# Patient Record
Sex: Male | Born: 1985 | Race: White | Hispanic: No | Marital: Single | Smoking: Never smoker
Health system: Southern US, Community
[De-identification: ages and names within clinical notes are randomized; demographics above are authoritative.]

## PROBLEM LIST (undated history)

## (undated) DIAGNOSIS — M2242 Chondromalacia patellae, left knee: Secondary | ICD-10-CM

## (undated) DIAGNOSIS — F32A Depression, unspecified: Secondary | ICD-10-CM

## (undated) DIAGNOSIS — F329 Major depressive disorder, single episode, unspecified: Secondary | ICD-10-CM

## (undated) DIAGNOSIS — F419 Anxiety disorder, unspecified: Secondary | ICD-10-CM

## (undated) DIAGNOSIS — I10 Essential (primary) hypertension: Secondary | ICD-10-CM

## (undated) DIAGNOSIS — R011 Cardiac murmur, unspecified: Secondary | ICD-10-CM

## (undated) HISTORY — DX: Essential (primary) hypertension: I10

## (undated) HISTORY — PX: ORIF FINGER FRACTURE: SHX2122

## (undated) HISTORY — DX: Anxiety disorder, unspecified: F41.9

## (undated) HISTORY — PX: ORIF HUMERUS FRACTURE: SHX2126

---
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
## 2006-08-08 IMAGING — CR DG CHEST 2V
2 series · 2 of 2 positions shown · non-contrast
Comparison: none

CLINICAL DATA: Chest pain, short of breath.  Chest pressure.  Arrhythmia.
 CHEST - 2 VIEW:
 The heart size and mediastinal contours are within normal limits.  Both lungs are clear.  The visualized skeletal structures are unremarkable.

[w chest pa]
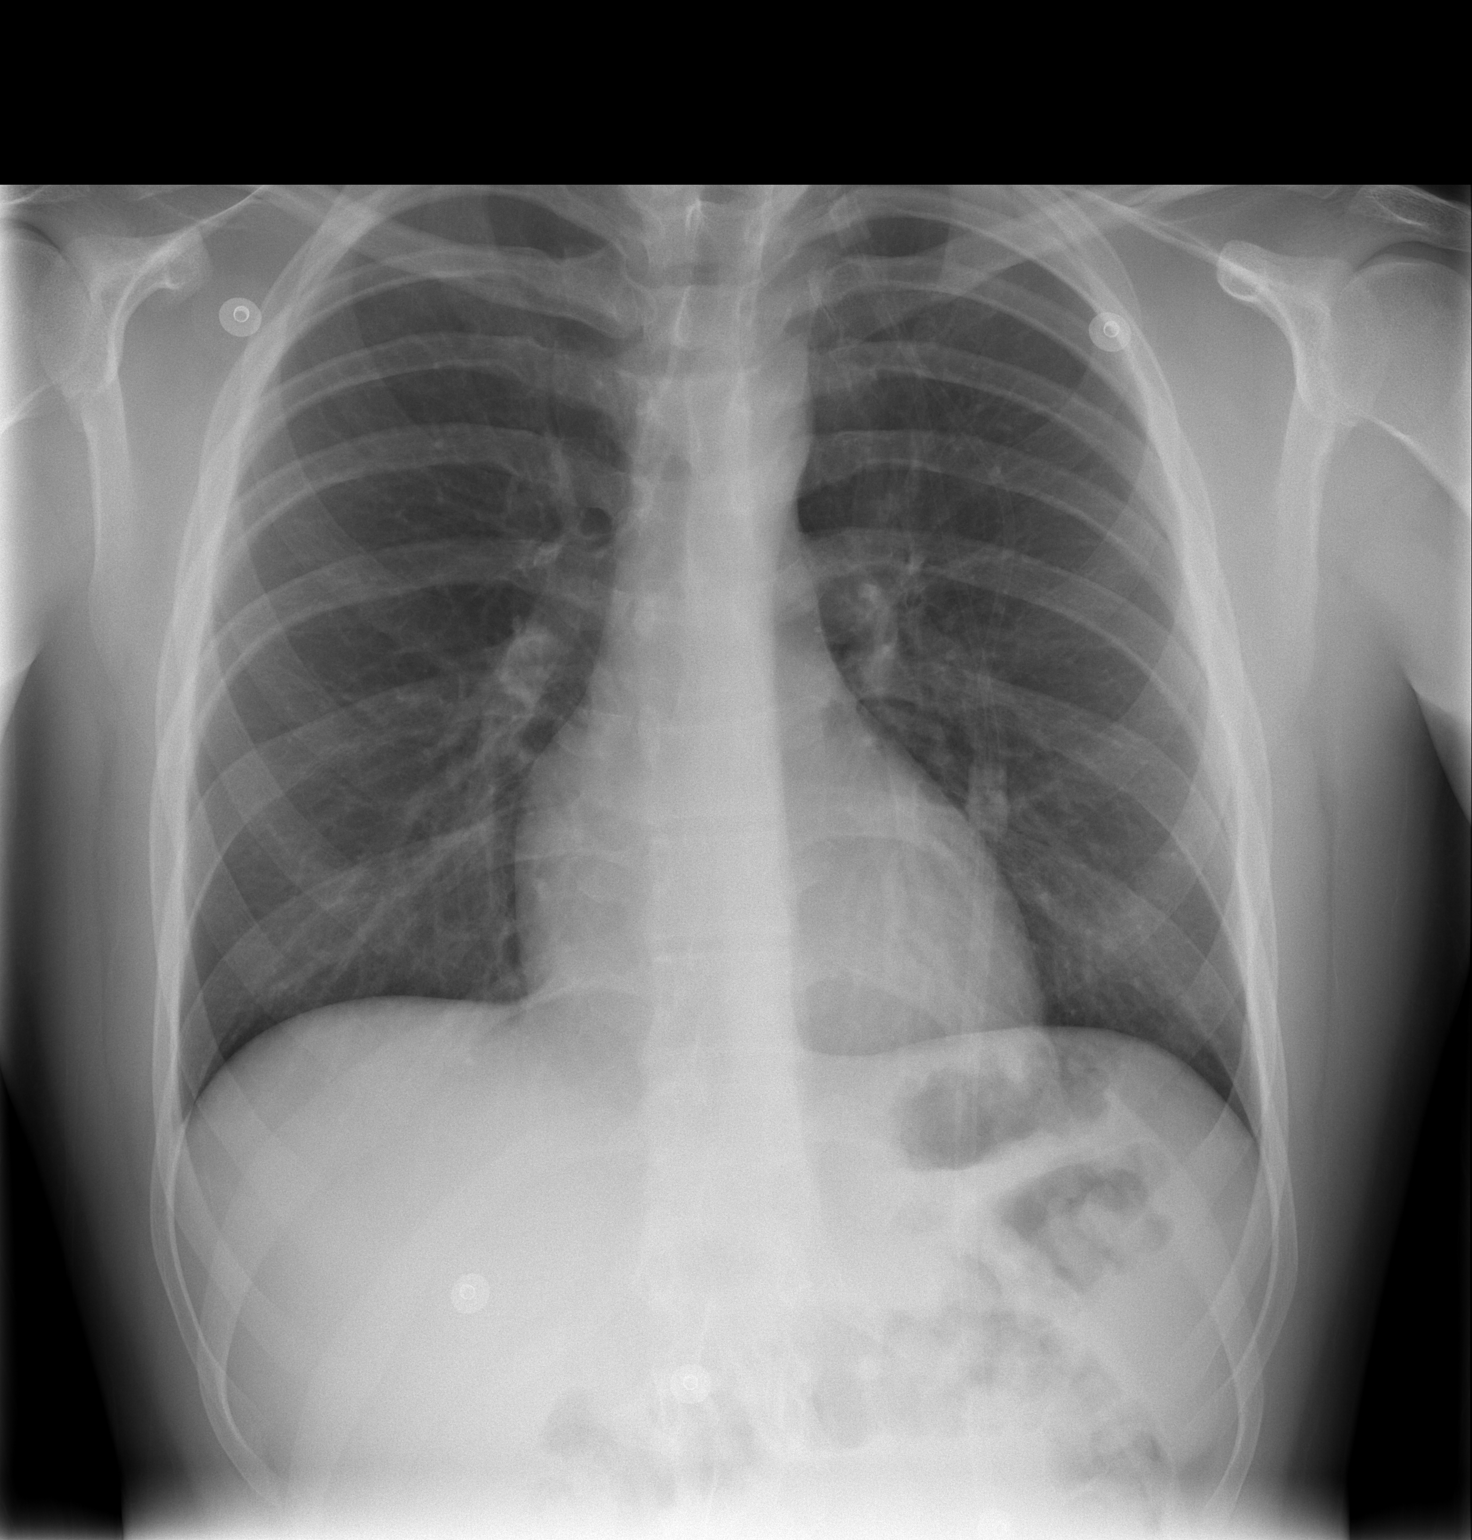

[w chest lat]
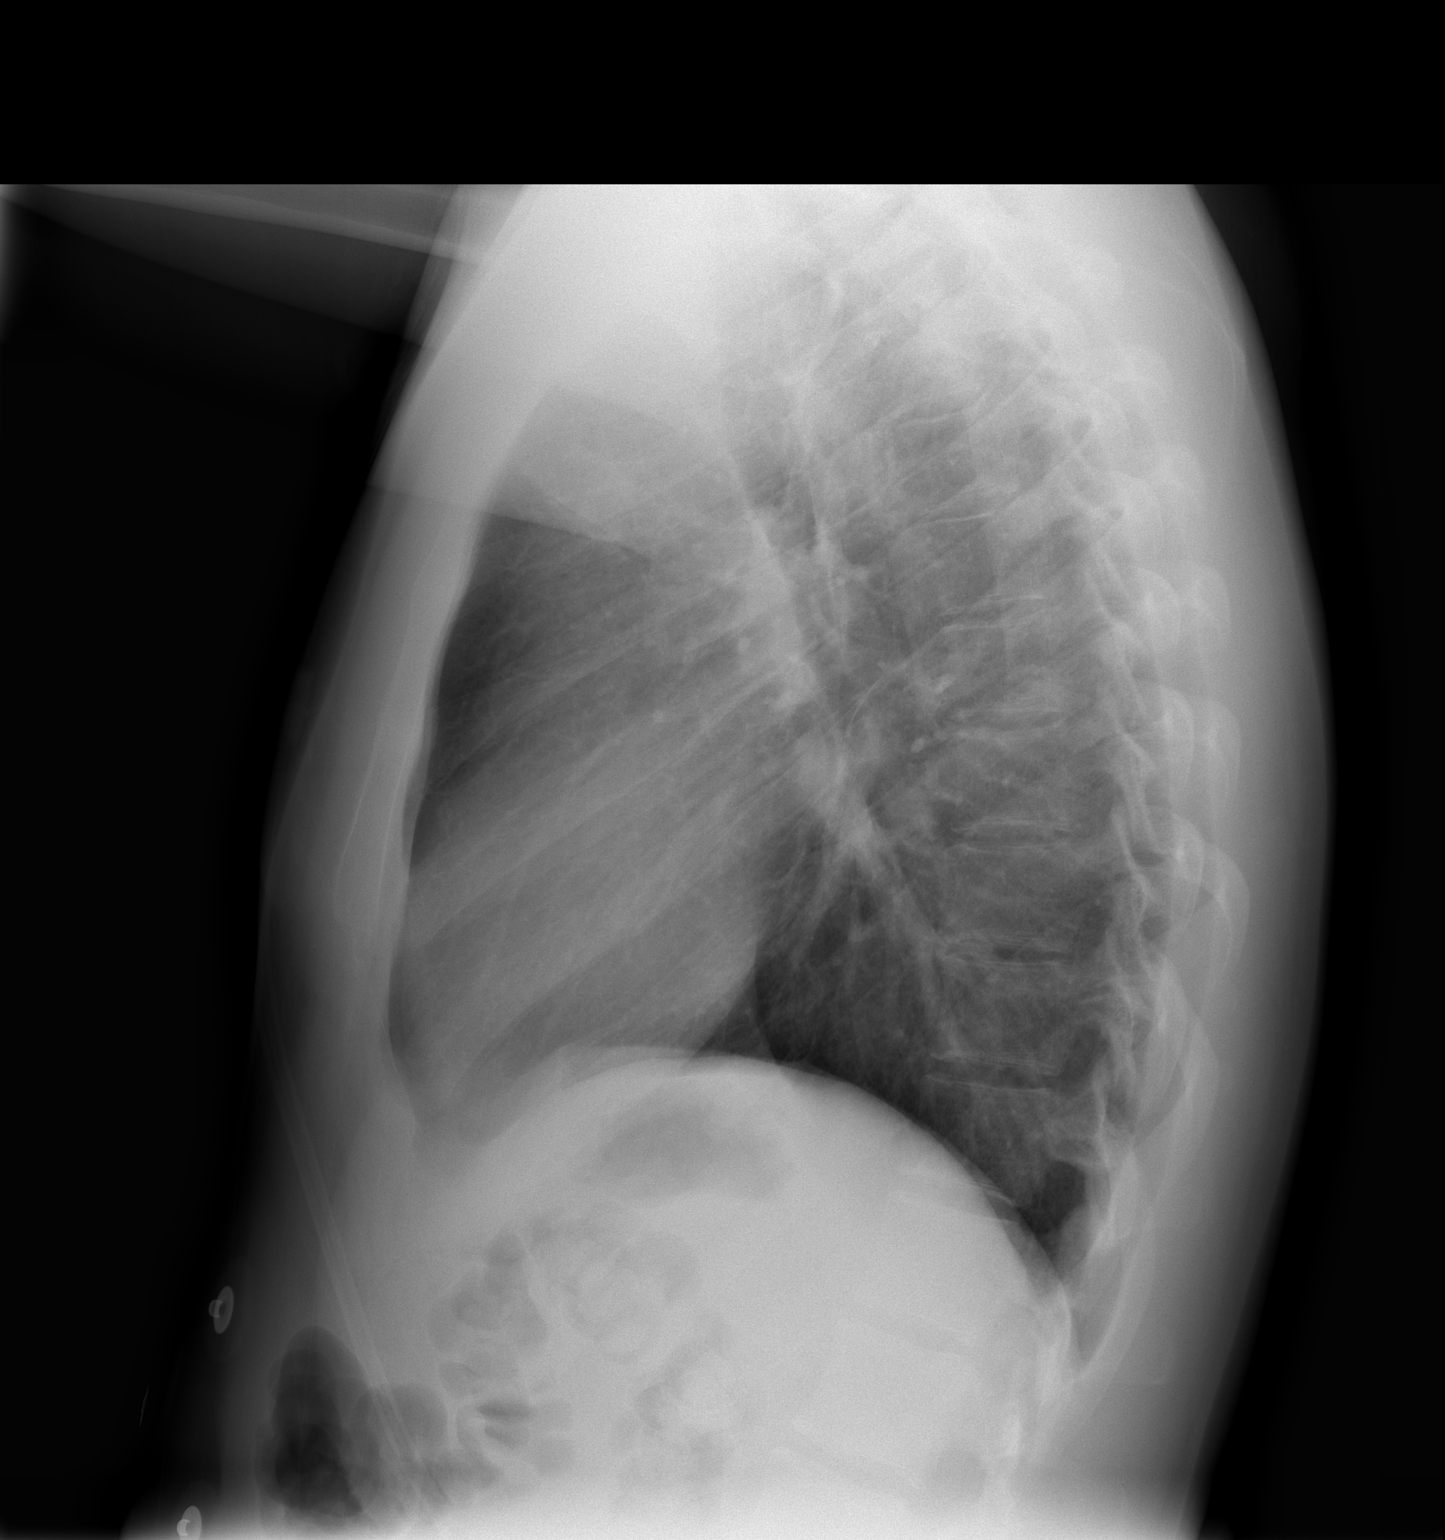

[2 of 2 positions shown; findings below may reference images not displayed]

IMPRESSION: No active cardiopulmonary disease.

## 2012-07-27 ENCOUNTER — Encounter: Payer: Self-pay | Admitting: Family Medicine

## 2012-07-27 DIAGNOSIS — F419 Anxiety disorder, unspecified: Secondary | ICD-10-CM | POA: Insufficient documentation

## 2012-07-27 DIAGNOSIS — S83006A Unspecified dislocation of unspecified patella, initial encounter: Secondary | ICD-10-CM | POA: Insufficient documentation

## 2012-08-21 ENCOUNTER — Telehealth: Payer: Self-pay | Admitting: *Deleted

## 2012-08-21 MED ORDER — PAROXETINE HCL 20 MG PO TABS: 20.0000 mg | ORAL_TABLET | Freq: Every day | ORAL | Status: DC

## 2012-08-21 NOTE — Telephone Encounter (Signed)
I gave him 30 days. Please let him know this was sent to his pharmacy Production assistant, radio) Thanks PG

## 2012-08-21 NOTE — Telephone Encounter (Signed)
PT NEEDS PAXIL REFILL FOR AT LEAST 5 DAYS. HE HAS APPT. NEXT WEEK. RX PAXIL.

## 2012-09-05 ENCOUNTER — Ambulatory Visit (INDEPENDENT_AMBULATORY_CARE_PROVIDER_SITE_OTHER): Payer: BC Managed Care – PPO | Admitting: Family Medicine

## 2012-09-05 ENCOUNTER — Encounter: Payer: Self-pay | Admitting: Family Medicine

## 2012-09-05 VITALS — BP 142/74 | HR 75 | Wt 228.0 lb

## 2012-09-05 DIAGNOSIS — I10 Essential (primary) hypertension: Secondary | ICD-10-CM

## 2012-09-05 DIAGNOSIS — F411 Generalized anxiety disorder: Secondary | ICD-10-CM

## 2012-09-05 MED ORDER — PAROXETINE HCL 20 MG PO TABS: 20.0000 mg | ORAL_TABLET | Freq: Every day | ORAL | Status: DC

## 2012-09-05 MED ORDER — HYDROCHLOROTHIAZIDE 25 MG PO TABS: 25.0000 mg | ORAL_TABLET | Freq: Every day | ORAL | Status: DC

## 2012-09-05 NOTE — Progress Notes (Signed)
Subjective:     Patient ID: Darryl Sloan, male   DOB: November 26, 1985, 27 y.o.   MRN: [id number]  HPI Darryl Sloan is here today to get his Paxil refilled.  He has done since his last office visit. His dad is interested in him coming off of medication.    Review of Systems  Psychiatric/Behavioral: The patient is not nervous/anxious.        Objective:   Physical Exam  Constitutional: He appears well-nourished. No distress.  Neck: No thyromegaly present.  Psychiatric: He has a normal mood and affect. His behavior is normal. Judgment and thought content normal.       Assessment:     Anxiety    Plan:     He will continue on the Paxil for now.  He may decide to wean himself down in the future.

## 2012-09-05 NOTE — Patient Instructions (Addendum)
1)  Anxiety - Whenever you decide you want to stop the Paxil, you need to do it VERY slowly.  I would recommend that you start by taking 1/2 tab daily for one month. They you can take either 1/4 daily or 1/2 every other day for a month and then cut back by that dosage weekly over the next month.    2)  Blood Pressure - Dash Diet in addition to the HCTZ.  If you lose weight and continue to exercise, your BP may go down enough to stop the HCTZ.     DASH Diet The DASH diet stands for "Dietary Approaches to Stop Hypertension." It is a healthy eating plan that has been shown to reduce high blood pressure (hypertension) in as little as 14 days, while also possibly providing other significant health benefits. These other health benefits include reducing the risk of breast cancer after menopause and reducing the risk of type 2 diabetes, heart disease, colon cancer, and stroke. Health benefits also include weight loss and slowing kidney failure in patients with chronic kidney disease.  DIET GUIDELINES  Limit salt (sodium). Your diet should contain less than 1500 mg of sodium daily.  Limit refined or processed carbohydrates. Your diet should include mostly whole grains. Desserts and added sugars should be used sparingly.  Include small amounts of heart-healthy fats. These types of fats include nuts, oils, and tub margarine. Limit saturated and trans fats. These fats have been shown to be harmful in the body. CHOOSING FOODS  The following food groups are based on a 2000 calorie diet. See your Registered Dietitian for individual calorie needs. Grains and Grain Products (6 to 8 servings daily)  Eat More Often: Whole-wheat bread, brown rice, whole-grain or wheat pasta, quinoa, popcorn without added fat or salt (air popped).  Eat Less Often: White bread, white pasta, white rice, cornbread. Vegetables (4 to 5 servings daily)  Eat More Often: Fresh, frozen, and canned vegetables. Vegetables may be raw,  steamed, roasted, or grilled with a minimal amount of fat.  Eat Less Often/Avoid: Creamed or fried vegetables. Vegetables in a cheese sauce. Fruit (4 to 5 servings daily)  Eat More Often: All fresh, canned (in natural juice), or frozen fruits. Dried fruits without added sugar. One hundred percent fruit juice ( cup [237 mL] daily).  Eat Less Often: Dried fruits with added sugar. Canned fruit in light or heavy syrup. [employer], Fish, and Poultry (2 servings or less daily. One serving is 3 to 4 oz [85-114 g]).  Eat More Often: Ninety percent or leaner ground beef, tenderloin, sirloin. Round cuts of beef, chicken breast, [place] breast. All fish. Grill, bake, or broil your meat. Nothing should be fried.  Eat Less Often/Avoid: Fatty cuts of meat, [place], or chicken leg, thigh, or wing. Fried cuts of meat or fish. Dairy (2 to 3 servings)  Eat More Often: Low-fat or fat-free milk, low-fat plain or light yogurt, reduced-fat or part-skim cheese.  Eat Less Often/Avoid: Milk (whole, 2%).Whole milk yogurt. Full-fat cheeses. Nuts, Seeds, and Legumes (4 to 5 servings per week)  Eat More Often: All without added salt.  Eat Less Often/Avoid: Salted nuts and seeds, canned beans with added salt. Fats and Sweets (limited)  Eat More Often: Vegetable oils, tub margarines without trans fats, sugar-free gelatin. Mayonnaise and salad dressings.  Eat Less Often/Avoid: Coconut oils, palm oils, butter, stick margarine, cream, half and half, cookies, candy, pie. FOR MORE INFORMATION The Dash Diet Eating Plan: www.dashdiet.org Document Released: 05/06/2011  Document Revised: 08/09/2011 Document Reviewed: 05/06/2011 [facility] Patient Information 2013 [place], [place].

## 2013-05-15 ENCOUNTER — Encounter: Payer: Self-pay | Admitting: Family Medicine

## 2013-05-15 ENCOUNTER — Ambulatory Visit (INDEPENDENT_AMBULATORY_CARE_PROVIDER_SITE_OTHER): Payer: BC Managed Care – PPO | Admitting: Family Medicine

## 2013-05-15 ENCOUNTER — Encounter (INDEPENDENT_AMBULATORY_CARE_PROVIDER_SITE_OTHER): Payer: Self-pay

## 2013-05-15 VITALS — BP 151/85 | HR 85 | Resp 16 | Ht 69.5 in | Wt 229.0 lb

## 2013-05-15 DIAGNOSIS — F411 Generalized anxiety disorder: Secondary | ICD-10-CM

## 2013-05-15 DIAGNOSIS — Z23 Encounter for immunization: Secondary | ICD-10-CM

## 2013-05-15 DIAGNOSIS — R5381 Other malaise: Secondary | ICD-10-CM

## 2013-05-15 DIAGNOSIS — I10 Essential (primary) hypertension: Secondary | ICD-10-CM

## 2013-05-15 MED ORDER — FLUOXETINE HCL 20 MG PO TABS: 20.0000 mg | ORAL_TABLET | Freq: Every day | ORAL | Status: DC

## 2013-05-15 MED ORDER — BUPROPION HCL ER (XL) 150 MG PO TB24: 150.0000 mg | ORAL_TABLET | ORAL | Status: DC

## 2013-05-15 MED ORDER — PAROXETINE HCL 10 MG PO TABS: ORAL_TABLET | ORAL | Status: DC

## 2013-05-15 NOTE — Progress Notes (Signed)
Subjective:    Patient ID: Darryl Sloan, male    DOB: 1986-05-26, 27 y.o.   MRN: [id number]  HPI  Darryl Sloan is here today to discuss a couple of issues:     1)  Anxiety/Depression:  He has been on Paxil for several years.  He feels that it has stabilized his mood but he feels that it is keeping him from losing weight.  He has tried to eat healthier and has been working out but he still does not lose weight.  He wants to switch to another medication that does not have weight gain as a side effect.    2)  Hypertension:  He has not been consistent taking his HCTZ.  He took it for a couple of months and thought that he did not need it.  His blood pressure is elevated today.     Review of Systems  Constitutional: Positive for unexpected weight change.  Eyes: Positive for visual disturbance.  Neurological: Positive for light-headedness.  All other systems reviewed and are negative.    Past Medical History  Diagnosis Date  . Anxiety   . Knee dislocation     Left - Torn Ligaments -  Dr Eulah Pont     Past Surgical History  Procedure Laterality Date  . Elbow surgery Right 2000    Upper arm - Dr Eulah Pont  . Knee surgery Left   . Finger surgery Right     Right Middle Finger     History   Social History Narrative   Marital Status: Single   Children:  None   Pets: Dogs (3)    Living Situation: Lives with his parents.    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 Diabetes type II Mother   . Depression Mother   . Leukemia Mother   . Hypertension Father   . Depression Brother      Current Outpatient Prescriptions on File Prior to Visit  Medication Sig Dispense Refill  . PARoxetine (PAXIL) 20 MG tablet Take 1 tablet (20 mg total) by mouth  daily.  90 tablet  1  . hydrochlorothiazide (HYDRODIURIL) 25 MG tablet Take 1 tablet (25 mg total) by mouth daily.  90 tablet  3   No current facility-administered medications on file prior to visit.     No Known Allergies   Immunization History  Administered Date(s) Administered  . Influenza,inj,Quad PF,36+ Mos 05/15/2013      Objective:   Physical Exam  Constitutional: He is oriented to person, place, and time. He appears well-nourished. No distress.  HENT:  Head: Normocephalic.  Eyes: No scleral icterus.  Neck: Neck supple. No thyromegaly present.  Cardiovascular: Normal rate, regular rhythm and normal heart sounds.   Pulmonary/Chest: Effort normal and breath sounds normal.  Musculoskeletal: Normal range of motion. He exhibits no edema.  Neurological: He is alert and oriented to person, place, and time.  Skin: Skin is warm and dry. No rash noted.  Psychiatric: He has a normal mood and affect. His behavior is normal. Judgment and thought content normal.      Assessment & Plan:    Darryl Sloan was seen today for medication management.  Diagnoses and associated orders for this visit:  Anxiety state, unspecified Comments: We will wean him down from Paxil and start him on Prozac.   - FLUoxetine (PROZAC) 20 MG tablet; Take 1 tablet (20 mg total) by mouth daily. - PARoxetine (PAXIL) 10 MG tablet; Alternate as we discussed with Prozac x 1 month  Other malaise and fatigue Comments: We'll also add some Wellbutrin to see how this works for him.  - buPROPion (WELLBUTRIN XL) 150 MG 24 hr tablet; Take 1 tablet (150 mg total) by mouth every morning.  Essential hypertension, benign Comments: He was reminded to take his HCTZ daily.   Need for prophylactic vaccination and inoculation against influenza - Flu Vaccine QUAD 36+ mos PF IM (Fluarix)   TIME SPENT "FACE TO FACE" WITH PATIENT -  30 MINS

## 2013-05-15 NOTE — Assessment & Plan Note (Signed)
The patient confirmed that they are not allergic to eggs and have never had a bad reaction with the flu shot in the past.  The vaccination was given without difficulty.   

## 2013-05-15 NOTE — Patient Instructions (Signed)
1)    Week 1 - Alternate 20 mg of Paxil at night with Prozac 20 mg in am  Week 2 - Take Prozac 20 mg every morning and 10 mg of Paxil at night Week 3 - Prozac in am and 5 mg of Paxil at night  Week 4 - Add Wellbutrin 150 mg to the Prozac 20 mg  Week 5 - Same as 4  Week 6 - Same as 5   During all this time you need to be taking your HCTZ every morning as well.    F/U in 6 weeks for a recheck of your mood/energy/BP  We will decide at that time if we will keep you on the 20/150 or if we need to increase to 40; 300   Hypertension As your heart beats, it forces blood through your arteries. This force is your blood pressure. If the pressure is too high, it is called hypertension (HTN) or high blood pressure. HTN is dangerous because you may have it and not know it. High blood pressure may mean that your heart has to work harder to pump blood. Your arteries may be narrow or stiff. The extra work puts you at risk for heart disease, stroke, and other problems.  Blood pressure consists of two numbers, a higher number over a lower, 110/72, for example. It is stated as "110 over 72." The ideal is below 120 for the top number (systolic) and under 80 for the bottom (diastolic). Write down your blood pressure today. You should pay close attention to your blood pressure if you have certain conditions such as:  Heart failure.  Prior heart attack.  Diabetes  Chronic kidney disease.  Prior stroke.  Multiple risk factors for heart disease. To see if you have HTN, your blood pressure should be measured while you are seated with your arm held at the level of the heart. It should be measured at least twice. A one-time elevated blood pressure reading (especially in the Emergency Department) does not mean that you need treatment. There may be conditions in which the blood pressure is different between your right and left arms. It is important to see your caregiver soon for a recheck. Most people have  essential hypertension which means that there is not a specific cause. This type of high blood pressure may be lowered by changing lifestyle factors such as:  Stress.  Smoking.  Lack of exercise.  Excessive weight.  Drug/tobacco/alcohol use.  Eating less salt. Most people do not have symptoms from high blood pressure until it has caused damage to the body. Effective treatment can often prevent, delay or reduce that damage. TREATMENT  When a cause has been identified, treatment for high blood pressure is directed at the cause. There are a large number of medications to treat HTN. These fall into several categories, and your caregiver will help you select the medicines that are best for you. Medications may have side effects. You should review side effects with your caregiver. If your blood pressure stays high after you have made lifestyle changes or started on medicines,   Your medication(s) may need to be changed.  Other problems may need to be addressed.  Be certain you understand your prescriptions, and know how and when to take your medicine.  Be sure to follow up with your caregiver within the time frame advised (usually within two weeks) to have your blood pressure rechecked and to review your medications.  If you are taking more than one medicine  to lower your blood pressure, make sure you know how and at what times they should be taken. Taking two medicines at the same time can result in blood pressure that is too low. SEEK IMMEDIATE MEDICAL CARE IF:  You develop a severe headache, blurred or changing vision, or confusion.  You have unusual weakness or numbness, or a faint feeling.  You have severe chest or abdominal pain, vomiting, or breathing problems. MAKE SURE YOU:   Understand these instructions.  Will watch your condition.  Will get help right away if you are not doing well or get worse. Document Released: 05/17/2005 Document Revised: 08/09/2011 Document  Reviewed: 01/05/2008 [facility] Patient Information 2014 [place], [place].

## 2013-06-25 ENCOUNTER — Encounter: Payer: Self-pay | Admitting: Family Medicine

## 2013-06-26 ENCOUNTER — Ambulatory Visit (INDEPENDENT_AMBULATORY_CARE_PROVIDER_SITE_OTHER): Payer: BC Managed Care – PPO | Admitting: Family Medicine

## 2013-06-26 ENCOUNTER — Encounter: Payer: Self-pay | Admitting: Family Medicine

## 2013-06-26 VITALS — BP 149/81 | HR 83 | Resp 16 | Ht 70.0 in | Wt 227.0 lb

## 2013-06-26 DIAGNOSIS — R5383 Other fatigue: Principal | ICD-10-CM

## 2013-06-26 DIAGNOSIS — R5381 Other malaise: Secondary | ICD-10-CM

## 2013-06-26 DIAGNOSIS — R21 Rash and other nonspecific skin eruption: Secondary | ICD-10-CM

## 2013-06-26 DIAGNOSIS — I10 Essential (primary) hypertension: Secondary | ICD-10-CM

## 2013-06-26 MED ORDER — BUPROPION HCL ER (XL) 150 MG PO TB24: 150.0000 mg | ORAL_TABLET | ORAL | Status: DC

## 2013-06-26 MED ORDER — MUPIROCIN 2 % EX OINT: TOPICAL_OINTMENT | CUTANEOUS | Status: DC

## 2013-06-26 NOTE — Progress Notes (Signed)
Subjective:    Patient ID: Darryl Sloan, male    DOB: Feb 02, 1986, 28 y.o.   MRN: [id number]  HPI  Darryl Sloan is here today to discuss the conditions listed below:   1)  Hypertension - He continues taking his hydrochlorothiazide.  He does not monitor his blood pressure at home.  His blood pressure is elevated today.   2)  Mood - He is doing great with the combination of Prozac and Wellbutrin.  He would like to continue on both.      Review of Systems  Neurological: Negative for light-headedness.  Psychiatric/Behavioral: Negative for sleep disturbance. The patient is not nervous/anxious.     Past Medical History  Diagnosis Date  . Anxiety   . Knee dislocation     Left - Torn Ligaments -  Dr Eulah Pont  . Hypertension      Past Surgical History  Procedure Laterality Date  . Elbow surgery Right 2000    Upper arm (Dr. Eulah Pont)  . Knee surgery Left 2004    Dr. Eulah Pont  . Finger surgery Right     Middle Finger     History   Social History Narrative   Marital Status: Single   Children:  None   Pets: Dogs (3)    Living Situation: Lives with his parents.    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 Diabetes type II Mother   . Depression Mother   . Leukemia Mother 54    S/P Bone Marrow Transplant  . Heart disease Mother     Mitral Valve Replacement   . Hypertension Father   . Depression Brother      Current Outpatient Prescriptions on File Prior to Visit  Medication Sig Dispense Refill  . FLUoxetine (PROZAC) 20 MG tablet Take 1 tablet (20 mg total) by mouth daily.  90 tablet  1   No current facility-administered medications on file prior to visit.     No Known Allergies   Immunization History  Administered Date(s) Administered   . Influenza,inj,Quad PF,36+ Mos 05/15/2013      Objective:   Physical Exam  Constitutional: He is oriented to person, place, and time. He appears well-nourished. No distress.  HENT:  Head: Normocephalic.  Eyes: No scleral icterus.  Neck: Neck supple. No thyromegaly present.  Cardiovascular: Normal rate, regular rhythm and normal heart sounds.   Pulmonary/Chest: Effort normal and breath sounds normal.  Musculoskeletal: Normal range of motion. He exhibits no edema.  Neurological: He is alert and oriented to person, place, and time.  Skin: Skin is warm and dry. Rash noted.  Psychiatric: He has a normal mood and affect. His behavior is normal. Judgment and thought content normal.       Assessment & Plan:    Darryl Sloan was seen today for medication management.  Diagnoses and associated orders for this visit:  Other malaise and fatigue Comments: He will continue on Wellbutrin 150 mg for 3 months.  - buPROPion (WELLBUTRIN XL) 150 MG 24 hr tablet; Take 1 tablet (150 mg total) by mouth every morning.  Rash and nonspecific skin eruption - mupirocin ointment (BACTROBAN) 2 %; Apply to affected area 3 times  daily  Essential hypertension, benign Comments: He is to work harder on his diet and limiting his sodium.

## 2013-06-26 NOTE — Patient Instructions (Signed)
1)  Mood - Stay on the combination of Wellbutrin XL 150 mg and Prozac 20 mg.  In 3 months, if your BP is under better control we could consider increasing your Wellbutrin XL to 300 mg to increase your energy even more if you'd like.    2)  BP - The first thing you can try is to lower your sodium and eat the foods shown in the DASH Diet.  You might also consider getting a BP cuff to monitor your pressure at home.  The Omron 7 series is a good one that is easy to use.     Sodium-Controlled Diet Sodium is a mineral. It is found in many foods. Sodium may be found naturally or added during the making of a food. The most common form of sodium is salt, which is made up of sodium and chloride. Reducing your sodium intake involves changing your eating habits. The following guidelines will help you reduce the sodium in your diet:  Stop using the salt shaker.  Use salt sparingly in cooking and baking.  Substitute with sodium-free seasonings and spices.  Do not use a salt substitute (potassium chloride) without your caregiver's permission.  Include a variety of fresh, unprocessed foods in your diet.  Limit the use of processed and convenience foods that are high in sodium. USE THE FOLLOWING FOODS SPARINGLY: Breads/Starches  Commercial bread stuffing, commercial pancake or waffle mixes, coating mixes. Waffles. Croutons. Prepared (boxed or frozen) potato, rice, or noodle mixes that contain salt or sodium. Salted [place] fries or hash browns. Salted popcorn, breads, crackers, chips, or snack foods. Vegetables  Vegetables canned with salt or prepared in cream, butter, or cheese sauces. Sauerkraut. Tomato or vegetable juices canned with salt.  Fresh vegetables are allowed if rinsed thoroughly. Fruit  Fruit is okay to eat. Meat and Meat Substitutes  Salted or smoked meats, such as bacon or Canadian bacon, chipped or corned beef, hot dogs, salt pork, luncheon meats, pastrami, ham, or sausage. Canned or  smoked fish, poultry, or meat. Processed cheese or cheese spreads, blue or [place] cheese. Battered or frozen fish products. Prepared spaghetti sauce. Baked beans. Reuben sandwiches. Salted nuts. Caviar. Milk  Limit buttermilk to 1 cup per week. Soups and Combination Foods  Bouillon cubes, canned or dried soups, broth, consomm. Convenience (frozen or packaged) dinners with more than 600 mg sodium. Pot pies, pizza, Asian food, fast food cheeseburgers, and specialty sandwiches. Desserts and Sweets  Regular (salted) desserts, pie, commercial fruit snack pies, commercial snack cakes, canned puddings.  Eat desserts and sweets in moderation. Fats and Oils  Gravy mixes or canned gravy. No more than 1 to 2 tbs of salad dressing. Chip dips.  Eat fats and oils in moderation. Beverages  See those listed under the vegetables and milk groups. Condiments  Ketchup, mustard, meat sauces, salsa, regular (salted) and lite soy sauce or mustard. Dill pickles, olives, meat tenderizer. Prepared horseradish or pickle relish. Dutch-processed cocoa. Baking powder or baking soda used medicinally. Worcestershire sauce. "Light" salt. Salt substitute, unless approved by your caregiver. Document Released: 11/06/2001 Document Revised: 08/09/2011 Document Reviewed: 06/09/2009 [facility] Patient Information 2014 [place].  3 to 4 Gram Sodium Diet, No Added Salt (NAS) A 3 to 4 gram sodium diet restricts the amount of sodium in the diet to no more than 3 to 4 g or 3000 to 4000 mg daily. Limiting the amount of sodium is often used to help lower blood pressure. It is important if you have heart,  liver, or kidney problems. Many foods contain sodium for flavor and sometimes as a preservative. When the amount of sodium in a diet needs to be low, it is important to know what to look for when choosing foods and drinks. The following includes some information and guidelines to help make it easier for you to adapt to a low  sodium diet. QUICK TIPS  Do not add salt to food.  Avoid convenience items and fast food.  Choose unsalted snack foods.  Buy lower sodium products, often labeled as "lower sodium" or "no salt added."  Check food labels to learn how much sodium is in 1 serving.  When eating at a restaurant, ask that your food be prepared with less salt or none, if possible. READING FOOD LABELS FOR SODIUM INFORMATION The nutrition facts label is a good place to find how much sodium is in foods. Look for products with no more than 500 to 600 mg of sodium per meal and no more than 150 mg per serving. Remember that 3 to 4 g = 3000 to 4000 mg. The food label may also list foods as:  Sodium-free: Less than 5 mg in a serving.  Very low sodium: 35 mg or less in a serving.  Low-sodium: 140 mg or less in a serving.  Light in sodium: 50% less sodium in a serving. For example, if a food that usually has 300 mg of sodium is changed to become light in sodium, it will have 150 mg of sodium.  Reduced sodium: 25% less sodium in a serving. For example, if a food that usually has 400 mg of sodium is changed to reduced sodium, it will have 300 mg of sodium. CHOOSING FOODS Grains  Avoid: Salted crackers and snack items. Bread stuffing and biscuit mixes. Seasoned rice or pasta mixes.  Choose: Unsalted snack items. English muffins, breads, and rolls. Homemade pancakes and waffles. Most cereals. Pasta. Meats  Avoid: Salted, canned, smoked, spiced, pickled meats, including fish and poultry. Bacon, ham, sausage, cold cuts, hot dogs, anchovies.  Choose: Low-sodium canned tuna and salmon. Fresh or frozen meat, poultry, and fish. Dairy  Avoid: Processed cheese and spreads. Cottage cheese. Buttermilk and condensed milk. Regular cheese.  Choose: Milk. Low-sodium cottage cheese. Yogurt. Sour cream. Low-sodium cheese. Fruits and Vegetables  Avoid: Regular canned vegetables. Regular canned tomato sauce and paste. Frozen  vegetables in sauces. Olives. Rosita Fire. Relishes. Sauerkraut.  Choose: Low-sodium canned vegetables. Low-sodium tomato sauce and paste. Frozen or fresh vegetables. Fresh and frozen fruit. Condiments  Avoid: Canned and packaged gravies. Worcestershire sauce. Tartar sauce. Barbecue sauce. Soy sauce. Steak sauce. Ketchup. Onion, garlic, and table salt. Meat flavorings and tenderizers.  Choose: Fresh and dried herbs and spices. Low-sodium varieties of mustard and ketchup. Lemon juice. Tabasco sauce. Horseradish. SAMPLE 3 TO 4 GRAM SODIUM MEAL PLAN  Breakfast / Sodium (mg)  1 cup low-fat milk / 143 mg  2 slices whole-wheat toast / 270 mg  1 tbs heart-healthy margarine / 153 mg  1 hard-boiled egg / 139 mg Lunch / Sodium (mg)  1 cup raw carrots / 76 mg   cup hummus / 298 mg  1 cup low-fat milk / 143 mg   cup red grapes / 2 mg  1 cup low-sodium chicken and rice soup / 480 mg  10 low-sodium saltine crackers / 191 mg Dinner / Sodium (mg)  1 cup whole-wheat pasta / 2 mg  1 cup tomato sauce / 1178 mg  3 oz lean ground  beef / 57 mg  1 small side salad (1 cup raw spinach leaves,  cup cucumber,  cup yellow bell pepper) / 25 mg  1 tsp ranch dressing / 144 mg Snack / Sodium (mg)  1 slice cheddar cheese / 258 mg  1 medium apple / 1 mg Nutrient Analysis  Calories: 2005  Protein: 85 g  Carbohydrate: 245 g  Fat: 78 g  Sodium: 3560 mg Document Released: 05/17/2005 Document Revised: 08/09/2011 Document Reviewed: 08/18/2009 [employer] Patient Information 2014 [place], [place].

## 2013-07-02 ENCOUNTER — Encounter: Payer: Self-pay | Admitting: Family Medicine

## 2013-07-02 ENCOUNTER — Ambulatory Visit (INDEPENDENT_AMBULATORY_CARE_PROVIDER_SITE_OTHER): Payer: BC Managed Care – PPO | Admitting: Family Medicine

## 2013-07-02 VITALS — BP 150/85 | HR 78 | Resp 16 | Wt 225.0 lb

## 2013-07-02 DIAGNOSIS — E669 Obesity, unspecified: Secondary | ICD-10-CM | POA: Insufficient documentation

## 2013-07-02 DIAGNOSIS — L259 Unspecified contact dermatitis, unspecified cause: Secondary | ICD-10-CM

## 2013-07-02 DIAGNOSIS — I10 Essential (primary) hypertension: Secondary | ICD-10-CM

## 2013-07-02 DIAGNOSIS — L309 Dermatitis, unspecified: Secondary | ICD-10-CM

## 2013-07-02 MED ORDER — OLMESARTAN MEDOXOMIL 40 MG PO TABS: ORAL_TABLET | ORAL | Status: DC

## 2013-07-02 MED ORDER — TELMISARTAN 80 MG PO TABS: 80.0000 mg | ORAL_TABLET | Freq: Every day | ORAL | Status: DC

## 2013-07-02 MED ORDER — CLOBETASOL PROPIONATE 0.05 % EX OINT: 1.0000 "application " | TOPICAL_OINTMENT | Freq: Two times a day (BID) | CUTANEOUS | Status: DC

## 2013-07-02 NOTE — Patient Instructions (Signed)
1)  Dry Skin/Eczema (Dyshydrotic Eczema - Hold the HCTZ; Apply the Clobetasol Ointment to arms/hands twice a day; You may also want to try some Equate T Gel - Apply to hands and let stand for 20 min the rinse; You may also want to get you some Eucerin Cream to use throughout the day.  If this does not resolve after stopping the HCTZ then hold on the Wellbutrin.  2)  BP - We are changing you to Benicar. Take 20 - 40 mg and see how your pressure looks.  You can compare the cost of the Benicar 40 mg to the Micardis (generic) 80 mg.      Hand Dermatitis Hand dermatitis (dyshidrotic eczema) is a skin condition in which small, itchy, raised dots or fluid-filled blisters form over the palms of the hands. Outbreaks of hand dermatitis can last 3 to 4 weeks. CAUSES  The cause of hand dermatitis is unknown. However, it occurs most often in patients with a history of allergies such as:  Hay fever.  Allergic asthma.  Allergies to latex. Chemical exposure, injuries, and environmental irritants can make hand dermatitis worse. Washing your hands too frequently can remove natural oils, which can dry out the skin and contribute to outbreaks of hand dermatitis. SYMPTOMS  The most common symptom of hand dermatitis is intense itching. Cracks or grooves (fissures) on the fingers can also develop. Affected areas can be painful, especially areas where large blisters have formed. DIAGNOSIS Your caregiver can usually tell what the problem is by doing a physical exam. PREVENTION  Avoid excessive hand washing.  Avoid the use of harsh chemicals.  Wear protective gloves when handling products that can irritate your skin. TREATMENT  Steroid creams and ointments, such as over-the-counter 1% hydrocortisone cream, can reduce inflammation and improve moisture retention. These should be applied at least 2 to 4 times per day. Your caregiver may ask you to use a stronger prescription steroid cream to help speed the healing  of blistered and cracked skin. In severe cases, oral steroid medicine may be needed. If you have an infection, antibiotics may be needed. Your caregiver may also prescribe antihistamines. These medicines help reduce itching. HOME CARE INSTRUCTIONS  Only take over-the-counter or prescription medicines as directed by your caregiver.  You may use wet or cold compresses. This can help:  Alleviate itching.  Increase the effectiveness of topical creams.  Minimize blisters. SEEK MEDICAL CARE IF:  The rash is not better after 1 week of treatment.  Signs of infection develop, such as redness, tenderness, or yellowish-white fluid (pus).  The rash is spreading. Document Released: 05/17/2005 Document Revised: 08/09/2011 Document Reviewed: 10/14/2010 [facility] Patient Information 2014 [place], [place].

## 2013-07-02 NOTE — Progress Notes (Signed)
   Subjective:    Patient ID: Darryl ShihJason C Sloan, male    DOB: 01/23/1986, 28 y.o.   MRN: [id number]  HPI  Barbara CowerJason is here today to have his skin evaluated.  He has itchy patches on his arms and back.  He is also having peeling of his hands.  He read online that this may be a side effect of  Wellbutrin.     Review of Systems  Skin: Positive for rash.       Several itchy patches in his arms and back.  Excessive peeling and dryness in both hands.      Past Medical History  Diagnosis Date  . Anxiety   . Knee dislocation     Left - Torn Ligaments -  Dr Eulah PontMurphy  . Hypertension      Past Surgical History  Procedure Laterality Date  . Elbow surgery Right 2000    Upper arm (Dr. Eulah PontMurphy)  . Knee surgery Left 2004    Dr. Eulah PontMurphy  . Finger surgery Right     Middle Finger     History   Social History Narrative   Marital Status: Single   Children:  None   Pets: Dogs (3)    Living Situation: Lives with his parents.    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 Diabetes type II Mother   . Depression Mother   . Leukemia Mother [id number]    S/P Bone Marrow Transplant  . Heart disease Mother     Mitral Valve Replacement   . Hypertension Father   . Depression Brother      Current Outpatient Prescriptions on File Prior to Visit  Medication Sig Dispense Refill  . buPROPion (WELLBUTRIN XL) 150 MG 24 hr tablet Take 1 tablet (150 mg total) by mouth every morning.  30 tablet  2  . FLUoxetine (PROZAC) 20 MG tablet Take 1 tablet (20 mg total) by mouth daily.  90 tablet  1   No current facility-administered medications on file prior to visit.     No Known Allergies   Immunization History  Administered Date(s) Administered  . Influenza,inj,Quad PF,36+ Mos  05/15/2013        Objective:   Physical Exam  Constitutional: He appears well-nourished. No distress.  Skin: Skin is warm and dry. Rash noted.             Assessment & Plan:    Barbara CowerJason was seen today for rash.  Diagnoses and associated orders for this visit:  Eczema - clobetasol ointment (TEMOVATE) 0.05 %; Apply 1 application topically 2 (two) times daily.  Essential hypertension, benign - olmesartan (BENICAR) 40 MG tablet; Take 1/2 - 1 tab daily for BP - telmisartan (MICARDIS) 80 MG tablet; Take 1 tablet (80 mg total) by mouth daily.

## 2013-09-20 ENCOUNTER — Encounter: Payer: Self-pay | Admitting: Family Medicine

## 2013-09-20 ENCOUNTER — Ambulatory Visit (INDEPENDENT_AMBULATORY_CARE_PROVIDER_SITE_OTHER): Payer: BC Managed Care – PPO | Admitting: Family Medicine

## 2013-09-20 VITALS — BP 150/70 | HR 82 | Resp 16 | Ht 69.5 in | Wt 228.0 lb

## 2013-09-20 DIAGNOSIS — R0989 Other specified symptoms and signs involving the circulatory and respiratory systems: Secondary | ICD-10-CM

## 2013-09-20 DIAGNOSIS — R0609 Other forms of dyspnea: Secondary | ICD-10-CM

## 2013-09-20 DIAGNOSIS — R5381 Other malaise: Secondary | ICD-10-CM

## 2013-09-20 DIAGNOSIS — F411 Generalized anxiety disorder: Secondary | ICD-10-CM

## 2013-09-20 DIAGNOSIS — I10 Essential (primary) hypertension: Secondary | ICD-10-CM

## 2013-09-20 DIAGNOSIS — L309 Dermatitis, unspecified: Secondary | ICD-10-CM

## 2013-09-20 DIAGNOSIS — R0683 Snoring: Secondary | ICD-10-CM

## 2013-09-20 DIAGNOSIS — L259 Unspecified contact dermatitis, unspecified cause: Secondary | ICD-10-CM

## 2013-09-20 DIAGNOSIS — R5383 Other fatigue: Principal | ICD-10-CM

## 2013-09-20 MED ORDER — FLUOXETINE HCL 20 MG PO TABS: 20.0000 mg | ORAL_TABLET | Freq: Every day | ORAL | Status: DC

## 2013-09-20 MED ORDER — BUPROPION HCL ER (XL) 150 MG PO TB24: 150.0000 mg | ORAL_TABLET | ORAL | Status: DC

## 2013-09-20 NOTE — Progress Notes (Signed)
Subjective:    Patient ID: Darryl Sloan, male    DOB: 05/31/1986, 28 y.o.   MRN: [id number]  HPI   Darryl Sloan is here today to discuss the conditions listed below:  1)  Mood - He continues to do well with the combination of Wellbutrin (150 mg daily) and Prozac (20 mg daily) and would like to remain on them.    2)  Hypertension - He continues to monitor his blood pressure daily and has noticed his blood pressure has been around 138-150 and 75-85.  He is doing well with his Telmisartan (80 mg). He says that his dad's BP went down when he started using a mouthpiece at night.    3)  Snoring - His family has noted that he snores loudly and they wonder about sleep apnea.     4)  Skin Rash - Improved.     Review of Systems  Constitutional: Negative for fatigue and unexpected weight change.  Cardiovascular: Negative for chest pain.  Skin: Negative for rash.  Neurological: Negative for light-headedness.  Psychiatric/Behavioral: The patient is not nervous/anxious.     Past Medical History  Diagnosis Date  . Anxiety   . Knee dislocation     Left - Torn Ligaments -  Dr Eulah PontMurphy  . Hypertension      Past Surgical History  Procedure Laterality Date  . Elbow surgery Right 2000    Upper arm (Dr. Eulah PontMurphy)  . Knee surgery Left 2004    Dr. Eulah PontMurphy  . Finger surgery Right     Middle Finger     History   Social History Narrative   Marital Status: Single   Children:  None   Pets: Dogs (3)    Living Situation: Lives with his parents.    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 Diabetes type II Mother   . Depression Mother   . Leukemia Mother [id number]    S/P Bone Marrow Transplant  . Heart disease Mother     Mitral Valve Replacement   . Hypertension  Father   . Depression Brother      Current Outpatient Prescriptions on File Prior to Visit  Medication Sig Dispense Refill  . clobetasol ointment (TEMOVATE) 0.05 % Apply 1 application topically 2 (two) times daily.  60 g  4  . telmisartan (MICARDIS) 80 MG tablet Take 1 tablet (80 mg total) by mouth daily.  30 tablet  11   No current facility-administered medications on file prior to visit.     No Known Allergies   Immunization History  Administered Date(s) Administered  . Influenza,inj,Quad PF,36+ Mos 05/15/2013      Objective:   Physical Exam  Constitutional: He is oriented to person, place, and time. He appears well-nourished. No distress.  HENT:  Head: Normocephalic.  Eyes: No scleral icterus.  Neck: Neck supple. No thyromegaly present.  Cardiovascular: Normal rate, regular rhythm and normal heart sounds.  Exam reveals no gallop and no friction rub.   No murmur heard. Pulmonary/Chest: Breath sounds normal. No respiratory distress. He exhibits no tenderness.  Musculoskeletal: He exhibits no edema.  Neurological: He is alert and oriented to person, place, and  time.  Skin: Skin is warm and dry. No rash noted.  Psychiatric: He has a normal mood and affect. His behavior is normal. Judgment and thought content normal.      Assessment & Plan:    Darryl Sloan was seen today for medication management.  Diagnoses and associated orders for this visit:  Other malaise and fatigue Comments: Improved on Wellbutrin.   - buPROPion (WELLBUTRIN XL) 150 MG 24 hr tablet; Take 1 tablet (150 mg total) by mouth every morning.  Anxiety state, unspecified Comments: He is doing well on Prozac.   - FLUoxetine (PROZAC) 20 MG tablet; Take 1 tablet (20 mg total) by mouth daily.  Essential hypertension, benign Comments: His BP was still elevated in the office today.  Since his BP monitor got a very similar reading, I'll trust his better readings outside of the office.    Eczema Comments:  Improved   Snoring Comments: We completed a questionnaire for sleep apnea and he scored 4/8.  He would like to try a mouth appliance before going for a sleep study.     TIME SPENT "FACE TO FACE" WITH PATIENT -  30 MINS

## 2013-09-20 NOTE — Patient Instructions (Signed)
1)  Blood Pressure - It remains elevated but since your BP monitor corresponds with our reading then I trust that your BP is lower at home.   Please continue to work on your diet and exercise and limit your intake of sodium.   2)  Mood  - Stay on the combination of Prozac and Wellbutrin.  3)  Snoring - You did snore 4/8 on the STOP BANG  Questionnaire which suggests that you are a high risk for sleep apnea.  Try the same type of mouthpiece your dad has used to see if this will decrease your snoring and help improve your BP.  If it does not and you develop additional symptoms consistent with sleep apnea, we can send you for a sleep study.      Sleep Apnea  Sleep apnea is a sleep disorder characterized by abnormal pauses in breathing while you sleep. When your breathing pauses, the level of oxygen in your blood decreases. This causes you to move out of deep sleep and into light sleep. As a result, your quality of sleep is poor, and the system that carries your blood throughout your body (cardiovascular system) experiences stress. If sleep apnea remains untreated, the following conditions can develop:  High blood pressure (hypertension).  Coronary artery disease.  Inability to achieve or maintain an erection (impotence).  Impairment of your thought process (cognitive dysfunction). There are three types of sleep apnea: 1. Obstructive sleep apnea Pauses in breathing during sleep because of a blocked airway. 2. Central sleep apnea Pauses in breathing during sleep because the area of the brain that controls your breathing does not send the correct signals to the muscles that control breathing. 3. Mixed sleep apnea A combination of both obstructive and central sleep apnea. RISK FACTORS The following risk factors can increase your risk of developing sleep apnea:  Being overweight.  Smoking.  Having narrow passages in your nose and throat.  Being of older age.  Being male.  Alcohol  use.  Sedative and tranquilizer use.  Ethnicity. Among individuals younger than 35 years, African Americans are at increased risk of sleep apnea. SYMPTOMS   Difficulty staying asleep.  Daytime sleepiness and fatigue.  Loss of energy.  Irritability.  Loud, heavy snoring.  Morning headaches.  Trouble concentrating.  Forgetfulness.  Decreased interest in sex. DIAGNOSIS  In order to diagnose sleep apnea, your caregiver will perform a physical examination. Your caregiver may suggest that you take a home sleep test. Your caregiver may also recommend that you spend the night in a sleep lab. In the sleep lab, several monitors record information about your heart, lungs, and brain while you sleep. Your leg and arm movements and blood oxygen level are also recorded. TREATMENT The following actions may help to resolve mild sleep apnea:  Sleeping on your side.   Using a decongestant if you have nasal congestion.   Avoiding the use of depressants, including alcohol, sedatives, and narcotics.   Losing weight and modifying your diet if you are overweight. There also are devices and treatments to help open your airway:  Oral appliances. These are custom-made mouthpieces that shift your lower jaw forward and slightly open your bite. This opens your airway.  Devices that create positive airway pressure. This positive pressure "splints" your airway open to help you breathe better during sleep. The following devices create positive airway pressure:  Continuous positive airway pressure (CPAP) device. The CPAP device creates a continuous level of air pressure with an air pump.  The air is delivered to your airway through a mask while you sleep. This continuous pressure keeps your airway open.  Nasal expiratory positive airway pressure (EPAP) device. The EPAP device creates positive air pressure as you exhale. The device consists of single-use valves, which are inserted into each nostril and  held in place by adhesive. The valves create very little resistance when you inhale but create much more resistance when you exhale. That increased resistance creates the positive airway pressure. This positive pressure while you exhale keeps your airway open, making it easier to breath when you inhale again.  Bilevel positive airway pressure (BPAP) device. The BPAP device is used mainly in patients with central sleep apnea. This device is similar to the CPAP device because it also uses an air pump to deliver continuous air pressure through a mask. However, with the BPAP machine, the pressure is set at two different levels. The pressure when you exhale is lower than the pressure when you inhale.  Surgery. Typically, surgery is only done if you cannot comply with less invasive treatments or if the less invasive treatments do not improve your condition. Surgery involves removing excess tissue in your airway to create a wider passage way. Document Released: 05/07/2002 Document Revised: 09/11/2012 Document Reviewed: 09/23/2011 [facility] Patient Information 2014 [place], [place].

## 2014-03-22 ENCOUNTER — Ambulatory Visit: Payer: BC Managed Care – PPO | Admitting: Family Medicine

## 2015-05-01 DIAGNOSIS — M2242 Chondromalacia patellae, left knee: Secondary | ICD-10-CM

## 2015-05-01 HISTORY — DX: Chondromalacia patellae, left knee: M22.42

## 2015-05-05 ENCOUNTER — Other Ambulatory Visit: Payer: Self-pay | Admitting: Physician Assistant

## 2015-05-05 NOTE — H&P (Signed)
Darryl Sloan presents as a new patient, as I have not seen him in a number of years.  This is for his left knee.  This is a chronic problem, but ever since this past September it has gotten increasingly worse with more in the way of mechanical symptoms, achiness, soreness and pain.  He was recently seen at [facility] by Dr. Theda Sers.  An MRI was obtained.  I have gotten the MRI, as well as the report and reviewed this all before his visit today.  Although he has had longstanding retropatellar issues, the current issue has been more mechanical from a loose body, in addition to retropatellar pain.  No instability.  MRI revealed no meniscal tears.  The medial and lateral compartments look fairly good.  Severe changes with areas of full thickness loss patellofemoral joint with significant changes in the trochlea as well.  A 12 x 7 mm calcified loose body in the anterolateral compartment, as well as one next to the patella which may be partially tethered there.  Moderate effusion.  I have looked at that scan and I agree.   Of note, this issue goes back to a longstanding problem I initially addressed back in 2004.  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  I treated this operatively in April of 2004.  This included arthroscopy, chondroplasty and removal of loose bodies, as well as an open medial capsular reefing and removal of avulsed bones from the medial retinaculum.  Recovered and rehabbed.  He has not had any further instability since then.  I have not actually seen him since 2006.  I did look at his op notes and of note, when he had that procedure he already had significant Grade III changes over much of his patella.   Remaining history is reviewed and updated.  I met with he and his father who is with him.     EXAMINATION: General exam is outlined and included in the chart.  Specifically, 29 year-old male.  Height: 5?10.  Weight: 209 pounds.  Focusing on the left knee,  he does have an increased Q angle on both sides.  It is not extreme, but present.  He has lateral tracking, some tethering, left greater than right.  Well healed incision with some widening of the actual incision.  He does have underlying global ligamentous laxity, moderate.  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  No real joint line tenderness.  1+ reactive effusion.  Ligaments stable.  Strength is reasonably good.  Neurovascularly intact distally.    X-RAYS: Four view standing x-ray shows some mild changes lateral compartment on the left.  For the most part however his joint spaces are preserved on both standing straight, as well as flexion views.  You can see the loose body in the front of his left knee.  On sunrise view there are profound changes patellofemoral joint on the left, really minimal on the right.    DISPOSITION:  I had a long extensive talk, spending more than 45 minutes, with Darryl Sloan and his dad.  I have explained that at the end of the day his final treatment is going to be a patellofemoral unicompartmental replacement.  The only question is whether or not further intervening things could be done at his young age to try to put that off longer.  I think he has passed a point where a Fulkerson procedure would be of great benefit.  We have discussed trying an exam  under anesthesia, arthroscopy, removal of loose bodies and chondroplasty.  Adding lateral release if he is tethering.  This would also give me a chance to look at his other compartments.  If that procedure fails to afford enough improvement and the other compartments look good, the next step would be a patellofemoral unicompartmental replacement.  I think it is reasonable to try that, although I have emphasized strongly to [facility] that at the end of the day he is going to end up with at least a partial knee replacement.  Procedure, risks, benefits and complications reviewed in detail.  Our anticipated and hopeful outcome is  outlined.  I think he is realistic and realizes that this may not be enough.     Ninetta Lights, M.D.

## 2015-05-08 ENCOUNTER — Encounter (HOSPITAL_BASED_OUTPATIENT_CLINIC_OR_DEPARTMENT_OTHER): Payer: Self-pay | Admitting: *Deleted

## 2015-05-08 NOTE — Pre-Procedure Instructions (Signed)
To come for EKG 

## 2015-05-09 ENCOUNTER — Other Ambulatory Visit: Payer: Self-pay

## 2015-05-09 ENCOUNTER — Encounter (HOSPITAL_BASED_OUTPATIENT_CLINIC_OR_DEPARTMENT_OTHER)
Admission: RE | Admit: 2015-05-09 | Discharge: 2015-05-09 | Disposition: A | Discharge status: Home / Self Care | Payer: BLUE CROSS/BLUE SHIELD | Source: Ambulatory Visit | Attending: Orthopedic Surgery | Admitting: Orthopedic Surgery

## 2015-05-09 DIAGNOSIS — Z01818 Encounter for other preprocedural examination: Secondary | ICD-10-CM | POA: Diagnosis present

## 2015-05-09 DIAGNOSIS — M2242 Chondromalacia patellae, left knee: Secondary | ICD-10-CM | POA: Insufficient documentation

## 2015-05-15 ENCOUNTER — Ambulatory Visit (HOSPITAL_BASED_OUTPATIENT_CLINIC_OR_DEPARTMENT_OTHER): Payer: BLUE CROSS/BLUE SHIELD | Admitting: Anesthesiology

## 2015-05-15 ENCOUNTER — Ambulatory Visit (HOSPITAL_BASED_OUTPATIENT_CLINIC_OR_DEPARTMENT_OTHER)
Admission: RE | Admit: 2015-05-15 | Discharge: 2015-05-15 | Disposition: A | Discharge status: Home / Self Care | Payer: BLUE CROSS/BLUE SHIELD | Source: Ambulatory Visit | Attending: Orthopedic Surgery | Admitting: Orthopedic Surgery

## 2015-05-15 ENCOUNTER — Encounter (HOSPITAL_BASED_OUTPATIENT_CLINIC_OR_DEPARTMENT_OTHER): Payer: Self-pay | Admitting: *Deleted

## 2015-05-15 ENCOUNTER — Encounter (HOSPITAL_BASED_OUTPATIENT_CLINIC_OR_DEPARTMENT_OTHER)
Admission: RE | Disposition: A | Discharge status: Home / Self Care | Payer: Self-pay | Source: Ambulatory Visit | Attending: Orthopedic Surgery

## 2015-05-15 DIAGNOSIS — E559 Vitamin D deficiency, unspecified: Secondary | ICD-10-CM | POA: Insufficient documentation

## 2015-05-15 DIAGNOSIS — F419 Anxiety disorder, unspecified: Secondary | ICD-10-CM | POA: Insufficient documentation

## 2015-05-15 DIAGNOSIS — Z91041 Radiographic dye allergy status: Secondary | ICD-10-CM | POA: Diagnosis not present

## 2015-05-15 DIAGNOSIS — I10 Essential (primary) hypertension: Secondary | ICD-10-CM | POA: Diagnosis not present

## 2015-05-15 DIAGNOSIS — M2242 Chondromalacia patellae, left knee: Secondary | ICD-10-CM | POA: Diagnosis present

## 2015-05-15 DIAGNOSIS — S83282A Other tear of lateral meniscus, current injury, left knee, initial encounter: Secondary | ICD-10-CM | POA: Diagnosis not present

## 2015-05-15 DIAGNOSIS — Z79899 Other long term (current) drug therapy: Secondary | ICD-10-CM | POA: Insufficient documentation

## 2015-05-15 DIAGNOSIS — F329 Major depressive disorder, single episode, unspecified: Secondary | ICD-10-CM | POA: Insufficient documentation

## 2015-05-15 DIAGNOSIS — M2342 Loose body in knee, left knee: Secondary | ICD-10-CM | POA: Diagnosis not present

## 2015-05-15 HISTORY — DX: Depression, unspecified: F32.A

## 2015-05-15 HISTORY — PX: KNEE ARTHROSCOPY WITH LATERAL RELEASE: SHX5649

## 2015-05-15 HISTORY — DX: Chondromalacia patellae, left knee: M22.42

## 2015-05-15 HISTORY — DX: Major depressive disorder, single episode, unspecified: F32.9

## 2015-05-15 HISTORY — DX: Cardiac murmur, unspecified: R01.1

## 2015-05-15 SURGERY — ARTHROSCOPY, KNEE, WITH LATERAL RETINACULUM RELEASE
Anesthesia: General | Site: Knee | Laterality: Left

## 2015-05-15 MED ORDER — HYDROMORPHONE HCL 1 MG/ML IJ SOLN
0.5000 mg | INTRAMUSCULAR | Status: DC | PRN
  Administered 2015-05-15: 0.5 mg via INTRAVENOUS

## 2015-05-15 MED ORDER — CHLORHEXIDINE GLUCONATE 4 % EX LIQD: 60.0000 mL | Freq: Once | CUTANEOUS | Status: DC

## 2015-05-15 MED ORDER — OXYCODONE-ACETAMINOPHEN 5-325 MG PO TABS
ORAL_TABLET | ORAL | Status: AC
  Filled 2015-05-15: qty 1

## 2015-05-15 MED ORDER — METOCLOPRAMIDE HCL 5 MG PO TABS: 5.0000 mg | ORAL_TABLET | Freq: Three times a day (TID) | ORAL | Status: DC | PRN

## 2015-05-15 MED ORDER — ONDANSETRON HCL 4 MG PO TABS: 4.0000 mg | ORAL_TABLET | Freq: Four times a day (QID) | ORAL | Status: DC | PRN

## 2015-05-15 MED ORDER — LIDOCAINE HCL (CARDIAC) 20 MG/ML IV SOLN
INTRAVENOUS | Status: DC | PRN
  Administered 2015-05-15: 100 mg via INTRAVENOUS

## 2015-05-15 MED ORDER — MIDAZOLAM HCL 5 MG/5ML IJ SOLN
INTRAMUSCULAR | Status: DC | PRN
  Administered 2015-05-15: 2 mg via INTRAVENOUS

## 2015-05-15 MED ORDER — HYDROMORPHONE HCL 1 MG/ML IJ SOLN
INTRAMUSCULAR | Status: AC
  Filled 2015-05-15: qty 1

## 2015-05-15 MED ORDER — ONDANSETRON HCL 4 MG PO TABS: 4.0000 mg | ORAL_TABLET | Freq: Three times a day (TID) | ORAL | Status: DC | PRN

## 2015-05-15 MED ORDER — BUPIVACAINE HCL (PF) 0.5 % IJ SOLN
INTRAMUSCULAR | Status: AC
  Filled 2015-05-15: qty 30

## 2015-05-15 MED ORDER — METHYLPREDNISOLONE ACETATE 80 MG/ML IJ SUSP
INTRAMUSCULAR | Status: DC | PRN
  Administered 2015-05-15: 80 mL via INTRA_ARTICULAR

## 2015-05-15 MED ORDER — FENTANYL CITRATE (PF) 100 MCG/2ML IJ SOLN
INTRAMUSCULAR | Status: DC | PRN
  Administered 2015-05-15: 50 ug via INTRAVENOUS
  Administered 2015-05-15: 100 ug via INTRAVENOUS

## 2015-05-15 MED ORDER — LIDOCAINE HCL (CARDIAC) 20 MG/ML IV SOLN
INTRAVENOUS | Status: AC
  Filled 2015-05-15: qty 5

## 2015-05-15 MED ORDER — MIDAZOLAM HCL 2 MG/2ML IJ SOLN
INTRAMUSCULAR | Status: AC
  Filled 2015-05-15: qty 2

## 2015-05-15 MED ORDER — ONDANSETRON HCL 4 MG/2ML IJ SOLN
INTRAMUSCULAR | Status: AC
  Filled 2015-05-15: qty 2

## 2015-05-15 MED ORDER — PROPOFOL 10 MG/ML IV BOLUS
INTRAVENOUS | Status: DC | PRN
  Administered 2015-05-15: 200 mg via INTRAVENOUS

## 2015-05-15 MED ORDER — LACTATED RINGERS IV SOLN
INTRAVENOUS | Status: DC
  Administered 2015-05-15 (×2): via INTRAVENOUS

## 2015-05-15 MED ORDER — DEXAMETHASONE SODIUM PHOSPHATE 4 MG/ML IJ SOLN
INTRAMUSCULAR | Status: DC | PRN
  Administered 2015-05-15: 10 mg via INTRAVENOUS

## 2015-05-15 MED ORDER — METHOCARBAMOL 1000 MG/10ML IJ SOLN: 500.0000 mg | Freq: Four times a day (QID) | INTRAVENOUS | Status: DC | PRN

## 2015-05-15 MED ORDER — METOCLOPRAMIDE HCL 5 MG/ML IJ SOLN: 5.0000 mg | Freq: Three times a day (TID) | INTRAMUSCULAR | Status: DC | PRN

## 2015-05-15 MED ORDER — PROMETHAZINE HCL 25 MG/ML IJ SOLN: 6.2500 mg | INTRAMUSCULAR | Status: DC | PRN

## 2015-05-15 MED ORDER — FENTANYL CITRATE (PF) 100 MCG/2ML IJ SOLN
INTRAMUSCULAR | Status: AC
  Filled 2015-05-15: qty 2

## 2015-05-15 MED ORDER — BUPIVACAINE HCL (PF) 0.5 % IJ SOLN
INTRAMUSCULAR | Status: DC | PRN
  Administered 2015-05-15: 20 mL

## 2015-05-15 MED ORDER — LACTATED RINGERS IV SOLN: INTRAVENOUS | Status: DC

## 2015-05-15 MED ORDER — BUPIVACAINE HCL (PF) 0.25 % IJ SOLN
INTRAMUSCULAR | Status: AC
  Filled 2015-05-15: qty 30

## 2015-05-15 MED ORDER — GLYCOPYRROLATE 0.2 MG/ML IJ SOLN: 0.2000 mg | Freq: Once | INTRAMUSCULAR | Status: DC | PRN

## 2015-05-15 MED ORDER — DEXAMETHASONE SODIUM PHOSPHATE 10 MG/ML IJ SOLN
INTRAMUSCULAR | Status: AC
  Filled 2015-05-15: qty 1

## 2015-05-15 MED ORDER — KETOROLAC TROMETHAMINE 30 MG/ML IJ SOLN
INTRAMUSCULAR | Status: AC
  Filled 2015-05-15: qty 1

## 2015-05-15 MED ORDER — FENTANYL CITRATE (PF) 100 MCG/2ML IJ SOLN: 25.0000 ug | INTRAMUSCULAR | Status: DC | PRN

## 2015-05-15 MED ORDER — OXYCODONE-ACETAMINOPHEN 5-325 MG PO TABS
1.0000 | ORAL_TABLET | ORAL | Status: DC | PRN
Start: 2015-05-15 — End: 2015-05-15
  Administered 2015-05-15: 1 via ORAL

## 2015-05-15 MED ORDER — ONDANSETRON HCL 4 MG/2ML IJ SOLN
INTRAMUSCULAR | Status: DC | PRN
  Administered 2015-05-15: 4 mg via INTRAVENOUS

## 2015-05-15 MED ORDER — SODIUM CHLORIDE 0.9 % IR SOLN
Status: DC | PRN
  Administered 2015-05-15: 4000 mL

## 2015-05-15 MED ORDER — CEFAZOLIN SODIUM-DEXTROSE 2-3 GM-% IV SOLR
INTRAVENOUS | Status: AC
  Filled 2015-05-15: qty 50

## 2015-05-15 MED ORDER — ONDANSETRON HCL 4 MG/2ML IJ SOLN: 4.0000 mg | Freq: Four times a day (QID) | INTRAMUSCULAR | Status: DC | PRN

## 2015-05-15 MED ORDER — METHOCARBAMOL 500 MG PO TABS: 500.0000 mg | ORAL_TABLET | Freq: Four times a day (QID) | ORAL | Status: DC | PRN

## 2015-05-15 MED ORDER — CEFAZOLIN SODIUM-DEXTROSE 2-3 GM-% IV SOLR
2.0000 g | INTRAVENOUS | Status: AC
  Administered 2015-05-15: 2 g via INTRAVENOUS

## 2015-05-15 MED ORDER — METHYLPREDNISOLONE ACETATE 80 MG/ML IJ SUSP
INTRAMUSCULAR | Status: AC
  Filled 2015-05-15: qty 1

## 2015-05-15 MED ORDER — MORPHINE SULFATE (PF) 4 MG/ML IV SOLN
INTRAVENOUS | Status: AC
  Filled 2015-05-15: qty 1

## 2015-05-15 MED ORDER — SCOPOLAMINE 1 MG/3DAYS TD PT72: 1.0000 | MEDICATED_PATCH | Freq: Once | TRANSDERMAL | Status: DC

## 2015-05-15 MED ORDER — OXYCODONE-ACETAMINOPHEN 5-325 MG PO TABS: 1.0000 | ORAL_TABLET | ORAL | Status: AC | PRN

## 2015-05-15 SURGICAL SUPPLY — 42 items
BANDAGE ELASTIC 6 VELCRO ST LF (GAUZE/BANDAGES/DRESSINGS) ×3 IMPLANT
BLADE CUDA 5.5 (BLADE) IMPLANT
BLADE CUDA GRT WHITE 3.5 (BLADE) IMPLANT
BLADE CUTTER GATOR 3.5 (BLADE) ×3 IMPLANT
BLADE CUTTER MENIS 5.5 (BLADE) IMPLANT
BLADE GREAT WHITE 4.2 (BLADE) ×2 IMPLANT
BLADE GREAT WHITE 4.2MM (BLADE) ×1
BUR OVAL 4.0 (BURR) IMPLANT
CUTTER MENISCUS  4.2MM (BLADE)
CUTTER MENISCUS 4.2MM (BLADE) IMPLANT
DRAPE ARTHROSCOPY W/POUCH 90 (DRAPES) ×3 IMPLANT
DRSG PAD ABDOMINAL 8X10 ST (GAUZE/BANDAGES/DRESSINGS) ×2 IMPLANT
DURAPREP 26ML APPLICATOR (WOUND CARE) ×3 IMPLANT
ELECT MENISCUS 165MM 90D (ELECTRODE) IMPLANT
ELECT REM PT RETURN 9FT ADLT (ELECTROSURGICAL)
ELECTRODE REM PT RTRN 9FT ADLT (ELECTROSURGICAL) IMPLANT
GAUZE SPONGE 4X4 12PLY STRL (GAUZE/BANDAGES/DRESSINGS) ×6 IMPLANT
GAUZE XEROFORM 1X8 LF (GAUZE/BANDAGES/DRESSINGS) ×3 IMPLANT
GLOVE BIOGEL PI IND STRL 7.0 (GLOVE) ×1 IMPLANT
GLOVE BIOGEL PI INDICATOR 7.0 (GLOVE) ×2
GLOVE ECLIPSE 7.0 STRL STRAW (GLOVE) ×3 IMPLANT
GLOVE SURG ORTHO 8.0 STRL STRW (GLOVE) ×3 IMPLANT
GOWN STRL REUS W/ TWL LRG LVL3 (GOWN DISPOSABLE) ×2 IMPLANT
GOWN STRL REUS W/ TWL XL LVL3 (GOWN DISPOSABLE) ×1 IMPLANT
GOWN STRL REUS W/TWL LRG LVL3 (GOWN DISPOSABLE) ×6
GOWN STRL REUS W/TWL XL LVL3 (GOWN DISPOSABLE) ×3
HOLDER KNEE FOAM BLUE (MISCELLANEOUS) ×3 IMPLANT
IV NS IRRIG 3000ML ARTHROMATIC (IV SOLUTION) ×3 IMPLANT
KNEE WRAP E Z 3 GEL PACK (MISCELLANEOUS) IMPLANT
MANIFOLD NEPTUNE II (INSTRUMENTS) ×3 IMPLANT
PACK ARTHROSCOPY DSU (CUSTOM PROCEDURE TRAY) ×3 IMPLANT
PACK BASIN DAY SURGERY FS (CUSTOM PROCEDURE TRAY) ×3 IMPLANT
PADDING CAST ABS 6INX4YD NS (CAST SUPPLIES) ×2
PADDING CAST ABS COTTON 6X4 NS (CAST SUPPLIES) IMPLANT
PENCIL BUTTON HOLSTER BLD 10FT (ELECTRODE) ×3 IMPLANT
SET ARTHROSCOPY TUBING (MISCELLANEOUS) ×3
SET ARTHROSCOPY TUBING LN (MISCELLANEOUS) ×1 IMPLANT
SPONGE GAUZE 4X4 12PLY STER LF (GAUZE/BANDAGES/DRESSINGS) ×2 IMPLANT
SUT ETHILON 3 0 PS 1 (SUTURE) ×3 IMPLANT
SUT VIC AB 3-0 FS2 27 (SUTURE) IMPLANT
TOWEL OR 17X24 6PK STRL BLUE (TOWEL DISPOSABLE) ×3 IMPLANT
WATER STERILE IRR 1000ML POUR (IV SOLUTION) ×3 IMPLANT

## 2015-05-15 NOTE — Anesthesia Preprocedure Evaluation (Addendum)
Anesthesia Evaluation  Patient identified by MRN, date of birth, ID band Patient awake    Reviewed: Allergy & Precautions, NPO status , Patient's Chart, lab work & pertinent test results  Airway Mallampati: II  TM Distance: >3 FB Neck ROM: Full    Dental  (+) Teeth Intact, Dental Advisory Given   Pulmonary neg pulmonary ROS,    Pulmonary exam normal breath sounds clear to auscultation       Cardiovascular Exercise Tolerance: Good hypertension, Pt. on medications (-) angina(-) CAD and (-) Past MI Normal cardiovascular exam Rhythm:Regular Rate:Normal     Neuro/Psych PSYCHIATRIC DISORDERS Anxiety Depression negative neurological ROS     GI/Hepatic negative GI ROS, Neg liver ROS,   Endo/Other  Obesity   Renal/GU negative Renal ROS     Musculoskeletal negative musculoskeletal ROS (+)   Abdominal   Peds  Hematology negative hematology ROS (+)   Anesthesia Other Findings Day of surgery medications reviewed with the patient.  Reproductive/Obstetrics                            Anesthesia Physical Anesthesia Plan  ASA: II  Anesthesia Plan: General   Post-op Pain Management:    Induction: Intravenous  Airway Management Planned: LMA  Additional Equipment:   Intra-op Plan:   Post-operative Plan: Extubation in OR  Informed Consent: I have reviewed the patients History and Physical, chart, labs and discussed the procedure including the risks, benefits and alternatives for the proposed anesthesia with the patient or authorized representative who has indicated his/her understanding and acceptance.   Dental advisory given  Plan Discussed with: CRNA  Anesthesia Plan Comments: (Risks/benefits of general anesthesia discussed with patient including risk of damage to teeth, lips, gum, and tongue, nausea/vomiting, allergic reactions to medications, and the possibility of heart attack, stroke and  death.  All patient questions answered.  Patient wishes to proceed.)        Anesthesia Quick Evaluation

## 2015-05-15 NOTE — Discharge Instructions (Signed)

## 2015-05-15 NOTE — Anesthesia Procedure Notes (Signed)
Procedure Name: LMA Insertion Date/Time: 05/15/2015 12:55 PM Performed by: Burna CashONRAD, Yoona Ishii C Pre-anesthesia Checklist: Patient identified, Emergency Drugs available, Suction available and Patient being monitored Patient Re-evaluated:Patient Re-evaluated prior to inductionOxygen Delivery Method: Circle System Utilized Preoxygenation: Pre-oxygenation with 100% oxygen Intubation Type: IV induction Ventilation: Mask ventilation without difficulty LMA: LMA inserted LMA Size: 5.0 Number of attempts: 1 Airway Equipment and Method: bite block Placement Confirmation: positive ETCO2 Tube secured with: Tape Dental Injury: Teeth and Oropharynx as per pre-operative assessment

## 2015-05-15 NOTE — Anesthesia Postprocedure Evaluation (Signed)
Anesthesia Post Note  Patient: Darryl ShihJason C Sloan  Procedure(s) Performed: Procedure(s) (LRB): LEFT KNEE ARTHROSCOPY WITH LATERAL menisectomy, Loose body, chondroplasty (Left)  Patient location during evaluation: PACU Anesthesia Type: General Level of consciousness: awake and alert Pain management: pain level controlled Vital Signs Assessment: post-procedure vital signs reviewed and stable Respiratory status: spontaneous breathing Cardiovascular status: blood pressure returned to baseline Anesthetic complications: no    Last Vitals:  Filed Vitals:   05/15/15 1415 05/15/15 1430  BP: 146/71 163/80  Pulse: 101 102  Temp:  37.2 C  Resp: 15 17    Last Pain:  Filed Vitals:   05/15/15 1436  PainSc: 3                  Kennieth RadFitzgerald, Eulice Rutledge E

## 2015-05-15 NOTE — Interval H&P Note (Signed)
History and Physical Interval Note:  05/15/2015 7:33 AM  Darryl Sloan  has presented today for surgery, with the diagnosis of LEFT KNEE CHONDROMALACIA PATELLAE   The various methods of treatment have been discussed with the patient and family. After consideration of risks, benefits and other options for treatment, the patient has consented to  Procedure(s): LEFT KNEE ARTHROSCOPY WITH LATERAL RELEASE,LOOSE BODY EXCISION, CHONDROPLASTY (Left) as a surgical intervention .  The patient's history has been reviewed, patient examined, no change in status, stable for surgery.  I have reviewed the patient's chart and labs.  Questions were answered to the patient's satisfaction.     Loreta Aveaniel F Murphy

## 2015-05-15 NOTE — Transfer of Care (Signed)
Immediate Anesthesia Transfer of Care Note  Patient: Darryl ShihJason C Granier  Procedure(s) Performed: Procedure(s): LEFT KNEE ARTHROSCOPY WITH LATERAL menisectomy, Loose body, chondroplasty (Left)  Patient Location: PACU  Anesthesia Type:General  Level of Consciousness: sedated  Airway & Oxygen Therapy: Patient Spontanous Breathing and Patient connected to face mask oxygen  Post-op Assessment: Report given to RN and Post -op Vital signs reviewed and stable  Post vital signs: Reviewed and stable  Last Vitals:  Filed Vitals:   05/15/15 0945  BP: 180/85  Pulse: 122  Temp: 36.6 C  Resp: 18    Complications: No apparent anesthesia complications

## 2015-05-16 ENCOUNTER — Encounter (HOSPITAL_BASED_OUTPATIENT_CLINIC_OR_DEPARTMENT_OTHER): Payer: Self-pay | Admitting: Orthopedic Surgery

## 2015-05-16 NOTE — Op Note (Signed)
NAMAnabel Halon:  Ramone, Hobie                  ACCOUNT NO.:  [id number]  MEDICAL RECORD NO.:  [id number]  LOCATION:                               FACILITY:  [facility]  PHYSICIAN:  Loreta Aveaniel F. Raileigh Sabater, M.D. 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  At least one large loose body.  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  At least one large loose body with a 7 x 12-mm osteochondral loose body in the front of the knee.  Unfortunately grade 3 changes, all compartments; both medial and lateral femoral condyles as well as the entire patellofemoral joint, portions of the lateral trochlea, grade 4.  Patellofemoral joint stable.  No tethering.  Also radial tear, posterior third of lateral meniscus.  PROCEDURES:  Left knee exam under anesthesia, arthroscopy. Tricompartmental chondroplasty.  Debridement of lateral meniscus. Removal of one large and numerous small loose bodies.  Lateral release not indicated.  SURGEON:  Loreta Aveaniel F. Quinten Allerton, M.D.  ASSISTANT:  Mikey KirschnerLindsey Stanberry, PA.  ANESTHESIA:  General.  BLOOD LOSS:  Minimal.  SPECIMENS:  None.  CULTURES:  None.  COMPLICATIONS:  None.  DRESSING:  Soft compressive.  TOURNIQUET:  Not employed.  DESCRIPTION OF PROCEDURE:  The patient was brought to the operating room and placed on the operating table in supine position.  After adequate anesthesia had been obtained, leg holder applied.  Leg was prepped and draped in usual sterile fashion.  Two portals; one each medial and lateral parapatellar.  Arthroscope was introduced.  Knee was distended and inspected.  Actually, good patellar tracking without instability. Although extensive degenerative changes did not really  tether. Chondroplasty to a stable surface throughout.  Although just about grade 4 lateral trochlea, micro-fracturing not an option.  There were periarticular spurs, medial and lateral gutter stable.  Spurs into the notch stable.  ACL intact.  Medial meniscus intact.  Relatively grade 3 changes weightbearing dome, both the medial and lateral femoral condyle, both debrided.  Tibial plateaus looked good.  Radial tear, posterior third lateral meniscus saucerized out, tapered in smoothly.  Lot of chondral loose bodies were removed.  There was a partially-tethered large osteochondral loose bodies sitting in the front of the knee anterolaterally, that I freed up and removed with large grasping forceps.  No loose bodies seen on the medial patella.  Entire knee examined.  No other findings were appreciated.  Instruments and fluids were removed.  Portals were closed with nylon.  Knee was injected with Depo-Medrol and Marcaine.  Sterile compressive dressing applied. Anesthesia reversed.  Brought to the recovery room.  Tolerated the surgery well.  No complications.     Loreta Aveaniel F. Leesha Veno, M.D.     DFM/MEDQ  D:  05/15/2015  T:  05/16/2015  Job:  [id number]

## 2016-02-04 ENCOUNTER — Encounter: Payer: Self-pay | Admitting: Sports Medicine

## 2016-02-04 ENCOUNTER — Ambulatory Visit (INDEPENDENT_AMBULATORY_CARE_PROVIDER_SITE_OTHER): Payer: BLUE CROSS/BLUE SHIELD | Admitting: Sports Medicine

## 2016-02-04 DIAGNOSIS — M25561 Pain in right knee: Secondary | ICD-10-CM

## 2016-02-04 NOTE — Progress Notes (Signed)
  Darryl Sloan - 30 y.o. male MRN [id number]  Date of birth: [birth year]  SUBJECTIVE:  IncGaylord Shihluding CC & ROS.  Chief Complaint  Patient presents with  . Knee Pain     Mr. Darryl Sloan is a 30 yo M that is presenting with right knee pain. [facility] and had an aspiration on Friday with an injection. He reports improvement of the symptoms since that time. He was told that he may need a knee replacement in the future. He reports the pain was on the medial aspect of his right knee. He notices after playing tennis. The pain has been occurring for 2-3 months. He has been taking some ibuprofen with some improvement. He is tried some home exercises as well. He denies any locking or buckling. He has not had any formal physical therapy.  ROS: No unexpected weight loss, fever, chills, swelling, instability,  numbness/tingling, redness, otherwise see HPI    HISTORY: Past Medical, Surgical, Social, and Family History Reviewed & Updated per EMR.   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  FHx -  No history of patellar dislocation  Medications - ibuprofen   [facility] REVIEWED[facility] as [facility].   PHYSICAL EXAM:  VS: BP:(!) 160/67  HR:76bpm  TEMP: ( )  RESP:   HT:5\' 10"  (177.8 cm)   WT:220 lb (99.8 kg)  BMI:31.6 PHYSICAL EXAM: Gen: NAD, alert, cooperative with exam, well-appearing HEENT: clear conjunctiva, EOMI CV:  no edema, capillary refill brisk,  Resp: non-labored, normal speech Skin: no rashes, normal turgor  Neuro: no gross deficits.  Psych:  alert and oriented Right Knee:  The right VMO and appears to be atrophied compared to the left. No obvious swelling. No tenderness palpation of the quadrant patellar tendon. No tenderness to palpation of the medial or lateral meniscus. Normal flexion and  extension. No pain with varus or valgus stress testing. Negative McMurray's test. Negative Lachman test. Some pain with patellar compression. No pain with patellar tilt he will be enrolled into the osteoarthritis Normal gait. Normal pulses. Normal sensation. Bilateral pes planus  Limited ultrasound: Right knee: Mild effusion observed in the suprapatellar pouch. Quadricep and patellar tendon with normal lung disease. Medial meniscus with narrowing as well as spurring. Letter meniscus appears to have normal joint space. Trochlea groove with some spurring and a shallow depth. The left knee was also evaluated and showed to have some medial space narrowing as well as some shallowness to the trochlear groove as well.  ASSESSMENT & PLAN:   Right knee pain Pain is most likely related to arthritic changes within the knee. Spring was observed in different areas as well as medial joint space narrowing. He also has a shallow are clear groove  - He will be enrolled into the osteoarthritis study.  - Encourage biking as much as possible - He'll follow-up in my clinic for custom orthotics.

## 2016-02-04 NOTE — Assessment & Plan Note (Signed)
Pain is most likely related to arthritic changes within the knee. Spring was observed in different areas as well as medial joint space narrowing. He also has a shallow are clear groove  - He will be enrolled into the osteoarthritis study.  - Encourage biking as much as possible - He'll follow-up in my clinic for custom orthotics.

## 2016-02-10 ENCOUNTER — Encounter: Payer: Self-pay | Admitting: Family Medicine

## 2016-02-10 ENCOUNTER — Ambulatory Visit (INDEPENDENT_AMBULATORY_CARE_PROVIDER_SITE_OTHER): Payer: BLUE CROSS/BLUE SHIELD | Admitting: Family Medicine

## 2016-02-10 DIAGNOSIS — M2141 Flat foot [pes planus] (acquired), right foot: Secondary | ICD-10-CM

## 2016-02-10 DIAGNOSIS — M2142 Flat foot [pes planus] (acquired), left foot: Secondary | ICD-10-CM

## 2016-02-10 NOTE — Assessment & Plan Note (Addendum)
He was placed in custom orthotics today and hopefully this will help with his pes planus as well as his knee pain. - He was advised to follow-up in a month if not having any improvement or recurrence of his knee pain.  Patient was fitted for a standard, cushioned, semi-rigid orthotic. The orthotic was heated and afterward the patient stood on the orthotic blank positioned on the orthotic stand. The patient was positioned in subtalar neutral position and 10 degrees of ankle dorsiflexion in a weight bearing stance. After completion of molding, a stable base was applied to the orthotic blank. The blank was ground to a stable position for weight bearing. Size: 10 Base: Blue EVA Additional Posting and Padding: None The patient ambulated these, and they were very comfortable.  I spent 40 minutes with this patient, greater than 50% was face-to-face time counseling regarding the below diagnosis.

## 2016-02-10 NOTE — Progress Notes (Signed)
  Gaylord ShihJason C Schommer - 30 y.o. male MRN [id number]  Date of birth: 25-Jan-1986  SUBJECTIVE:  Including CC & ROS.   Mr. Pasty ArchLaws is a 30 yo M that is following up in regards to his knee pain and his pes planus. His right knee pain is suspected be associated with some arthritic changes. He was advised to follow-up today for custom orthotics. Hopefully this will help his knee pain in the future. 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  HISTORY: Past Medical, Surgical, Social, and Family History Reviewed & Updated per EMR.   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  FHx -  No history of patellar dislocation  Medications - ibuprofen  PHYSICAL EXAM:  VS: BP:(!) 148/80  HR: bpm  TEMP: ( )  RESP:   HT:5\' 10"  (177.8 cm)   WT:220 lb (99.8 kg)  BMI:31.6 PHYSICAL EXAM: Gen: NAD, alert, cooperative with exam, well-appearing HEENT: clear conjunctiva, EOMI CV:  no edema, capillary refill brisk,  Resp: non-labored, normal speech Skin: no rashes, normal turgor  Neuro: no gross deficits.  Psych:  alert and oriented MSK:  Bilateral. Normal pulses. Normal sensation.   ASSESSMENT & PLAN:   Pes planus of both feet He was placed in custom orthotics today and hopefully this will help with his pes planus as well as his knee pain. - He was advised to follow-up in a month if not having any improvement or recurrence of his knee pain.  Patient was fitted for a standard, cushioned, semi-rigid orthotic. The orthotic was heated and afterward the patient stood on the orthotic blank positioned on the orthotic stand. The patient was positioned in subtalar neutral position and 10 degrees of ankle dorsiflexion in a weight bearing stance. After completion of molding, a stable base was applied to the orthotic blank. The blank was ground to a  stable position for weight bearing. Size: 10 Base: Blue EVA Additional Posting and Padding: None The patient ambulated these, and they were very comfortable.  I spent 40 minutes with this patient, greater than 50% was face-to-face time counseling regarding the below diagnosis.
